# Patient Record
Sex: Male | Born: 1975 | Race: Black or African American | Hispanic: No | Marital: Married | Smoking: Never smoker
Health system: Southern US, Community
[De-identification: ages and names within clinical notes are randomized; demographics above are authoritative.]

---

## 2000-03-03 ENCOUNTER — Encounter: Payer: Self-pay | Admitting: Emergency Medicine

## 2000-03-03 ENCOUNTER — Emergency Department (HOSPITAL_COMMUNITY): Admission: EM | Admit: 2000-03-03 | Discharge: 2000-03-03 | Payer: Self-pay | Admitting: Emergency Medicine

## 2000-03-06 ENCOUNTER — Emergency Department (HOSPITAL_COMMUNITY): Admission: EM | Admit: 2000-03-06 | Discharge: 2000-03-06 | Payer: Self-pay | Admitting: Emergency Medicine

## 2000-03-13 ENCOUNTER — Emergency Department (HOSPITAL_COMMUNITY): Admission: EM | Admit: 2000-03-13 | Discharge: 2000-03-14 | Payer: Self-pay | Admitting: Emergency Medicine

## 2004-09-24 ENCOUNTER — Emergency Department (HOSPITAL_COMMUNITY): Admission: EM | Admit: 2004-09-24 | Discharge: 2004-09-24 | Payer: Self-pay | Admitting: Emergency Medicine

## 2004-09-26 ENCOUNTER — Emergency Department (HOSPITAL_COMMUNITY): Admission: EM | Admit: 2004-09-26 | Discharge: 2004-09-26 | Payer: Self-pay | Admitting: Family Medicine

## 2008-03-29 ENCOUNTER — Emergency Department (HOSPITAL_COMMUNITY): Admission: EM | Admit: 2008-03-29 | Discharge: 2008-03-30 | Payer: Self-pay | Admitting: Emergency Medicine
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
## 2022-03-27 ENCOUNTER — Encounter (HOSPITAL_COMMUNITY): Payer: Self-pay | Admitting: *Deleted

## 2022-03-27 ENCOUNTER — Ambulatory Visit (HOSPITAL_COMMUNITY)
Admission: EM | Admit: 2022-03-27 | Discharge: 2022-03-27 | Disposition: A | Discharge status: Home / Self Care | Payer: Self-pay | Attending: Family Medicine | Admitting: Family Medicine

## 2022-03-27 DIAGNOSIS — H5789 Other specified disorders of eye and adnexa: Secondary | ICD-10-CM

## 2022-03-27 MED ORDER — OLOPATADINE HCL 0.2 % OP SOLN: 1.0000 [drp] | Freq: Every day | OPHTHALMIC | 0 refills | Status: AC

## 2022-03-27 NOTE — ED Provider Notes (Signed)
  [place]   [id number] 03/27/22 Arrival Time: 2694  ASSESSMENT & PLAN:  1. Irritation of left eye    Meds ordered this encounter  Medications   Olopatadine HCl 0.2 % SOLN    Sig: Apply 1 drop to eye daily.    Dispense:  2.5 mL    Refill:  0   Work note provided.  Ophthalmic drops per orders. Local eye care discussed.  Reviewed expectations re: course of current medical issues. Questions answered. Outlined signs and symptoms indicating need for more acute intervention. Patient verbalized understanding. After Visit Summary given.   SUBJECTIVE:  Marvin Carroll is a 46 y.o. male who presents with complaint of LEFT eye "irritation"; noted this morning. Mild itching and watery discharge. H/O seasonal allergies; questions relation. Visual changes: no. Contact lens use: no. Recent illness: no. No tx PTA.  OBJECTIVE:  Vitals:   03/27/22 1006  BP: (!) 154/92  Pulse: 76  Resp: 18  Temp: 98.4 F (36.9 C)  TempSrc: Oral  SpO2: 98%    General appearance: alert; no distress HEENT: [place]; AT; PERRLA; no restriction of the extraocular movements OS: without reported pain; with mild conjunctival injection; with watery drainage; without corneal opacities; without limbal flush; without periorbital swelling or erythema Neck: supple without LAD Skin: warm and dry Psychological: alert and cooperative; normal mood and affect  No Known Allergies  History reviewed. No pertinent past medical history. Social History   Socioeconomic History   Marital status: Married    Spouse name: Not on file   Number of children: Not on file   Years of education: Not on file   Highest education level: Not on file  Occupational History   Not on file  Tobacco Use   Smoking status: Never   Smokeless tobacco: Never  Vaping Use   Vaping Use: Never used  Substance and Sexual Activity   Alcohol use: Yes    Comment: rare   Drug use: Never   Sexual activity: Not Currently   Other Topics Concern   Not on file  Social History Narrative   Not on file   Social Determinants of Health   Financial Resource Strain: Not on file  Food Insecurity: Not on file  Transportation Needs: Not on file  Physical Activity: Not on file  Stress: Not on file  Social Connections: Not on file  Intimate Partner Violence: Not on file   Family History  Problem Relation Age of Onset   Healthy Mother    Healthy Father    History reviewed. No pertinent surgical history.    Vanessa Kick, MD 03/27/22 1308

## 2022-03-27 NOTE — ED Triage Notes (Signed)
Pt states his left eye is pink he has been rubbing it a lot. This started at [place]. 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

## 2022-10-14 ENCOUNTER — Encounter (HOSPITAL_COMMUNITY): Payer: Self-pay

## 2022-10-14 ENCOUNTER — Emergency Department (HOSPITAL_COMMUNITY)
Admission: EM | Admit: 2022-10-14 | Discharge: 2022-10-14 | Disposition: A | Discharge status: Home / Self Care | Payer: BC Managed Care – PPO | Attending: Emergency Medicine | Admitting: Emergency Medicine

## 2022-10-14 ENCOUNTER — Emergency Department (HOSPITAL_COMMUNITY): Payer: BC Managed Care – PPO

## 2022-10-14 ENCOUNTER — Other Ambulatory Visit: Payer: Self-pay

## 2022-10-14 DIAGNOSIS — Z043 Encounter for examination and observation following other accident: Secondary | ICD-10-CM | POA: Diagnosis not present

## 2022-10-14 DIAGNOSIS — S52511A Displaced fracture of right radial styloid process, initial encounter for closed fracture: Secondary | ICD-10-CM | POA: Diagnosis not present

## 2022-10-14 DIAGNOSIS — S32001A Stable burst fracture of unspecified lumbar vertebra, initial encounter for closed fracture: Secondary | ICD-10-CM | POA: Diagnosis not present

## 2022-10-14 DIAGNOSIS — S52591A Other fractures of lower end of right radius, initial encounter for closed fracture: Secondary | ICD-10-CM | POA: Insufficient documentation

## 2022-10-14 DIAGNOSIS — W010XXA Fall on same level from slipping, tripping and stumbling without subsequent striking against object, initial encounter: Secondary | ICD-10-CM | POA: Diagnosis not present

## 2022-10-14 DIAGNOSIS — M549 Dorsalgia, unspecified: Secondary | ICD-10-CM | POA: Diagnosis not present

## 2022-10-14 DIAGNOSIS — S32011A Stable burst fracture of first lumbar vertebra, initial encounter for closed fracture: Secondary | ICD-10-CM | POA: Diagnosis not present

## 2022-10-14 MED ORDER — IBUPROFEN 600 MG PO TABS: 600.0000 mg | ORAL_TABLET | Freq: Four times a day (QID) | ORAL | 0 refills | Status: AC | PRN

## 2022-10-14 MED ORDER — OXYCODONE HCL 5 MG PO TABS: 2.5000 mg | ORAL_TABLET | ORAL | 0 refills | Status: AC | PRN

## 2022-10-14 MED ORDER — ACETAMINOPHEN 500 MG PO TABS
1000.0000 mg | ORAL_TABLET | Freq: Once | ORAL | Status: AC
  Administered 2022-10-14: 1000 mg via ORAL
  Filled 2022-10-14: qty 2

## 2022-10-14 MED ORDER — OXYCODONE HCL 5 MG PO TABS
5.0000 mg | ORAL_TABLET | Freq: Once | ORAL | Status: DC
  Filled 2022-10-14: qty 1

## 2022-10-14 NOTE — Progress Notes (Signed)
Orthopedic Tech Progress Note Patient Details:  Marvin Carroll 08-Dec-1975 [id number] TLSO brace has been ordered from [facility]  Patient ID: [name], male   DOB: [birth year], 47 y.o.   MRN: [id number]  [name] 10/14/2022, 9:44 PM

## 2022-10-14 NOTE — ED Provider Notes (Incomplete)
[place] EMERGENCY DEPARTMENT AT [facility] Provider Note   CSN: [id number] Arrival date & time: 10/14/22  1541     History {Add pertinent medical, surgical, social history, OB history to HPI:1} Chief Complaint  Patient presents with  . Fall    Marvin Carroll is a 47 y.o. male presents emergency department chief complaint of back and wrist pain.  Patient states that he was walking down his steps when he missed a stair, fell down 8 steps landed on his butt with his right hand trying to catch himself on the right side.  He has significant pain in his right wrist and mid back.  He is having difficulty finding a comfortable position.  He has been ambulatory since that time.  He denies numbness or tingling in his legs he denies any weakness or saddle anesthesia.  He has no numbing or numbness or tingling in the right hand.  He is right-hand dominant.  He did not hit his head or lose consciousness.   Fall       Home Medications Prior to Admission medications   Medication Sig Start Date End Date Taking? Authorizing Provider  Olopatadine HCl 0.2 % SOLN Apply 1 drop to eye daily. 03/27/22   Mardella Layman, MD      Allergies    Patient has no known allergies.    Review of Systems   Review of Systems  Physical Exam Updated Vital Signs BP (!) 143/87   Pulse 93   Temp 98.6 F (37 C) (Oral)   Resp 20   Ht 5\' 8"  (1.727 m)   Wt 89.4 kg   SpO2 96%   BMI 29.95 kg/m  Physical Exam Vitals and nursing note reviewed.  Constitutional:      General: He is not in acute distress.    Appearance: He is well-developed. He is not diaphoretic.  HENT:     Head: Normocephalic and atraumatic.  Eyes:     General: No scleral icterus.    Conjunctiva/sclera: Conjunctivae normal.  Cardiovascular:     Rate and Rhythm: Normal rate and regular rhythm.     Heart sounds: Normal heart sounds.  Pulmonary:     Effort: Pulmonary effort is normal. No respiratory distress.     Breath  sounds: Normal breath sounds.  Abdominal:     Palpations: Abdomen is soft.     Tenderness: There is no abdominal tenderness.  Musculoskeletal:     Cervical back: Normal range of motion and neck supple.     Comments: Wrist with deformity at the wrist.  Full range of motion of the fingers, radial ulnar and median. AIN/PIN/U intact. Radial pulse 2+ no scaphoid tenderness  Normal strength and sensation of the lower extremities, range of motion limited due to pain in the back, midline tenderness present.  Skin:    General: Skin is warm and dry.  Neurological:     Mental Status: He is alert.     Deep Tendon Reflexes: Reflexes normal.  Psychiatric:        Behavior: Behavior normal.     ED Results / Procedures / Treatments   Labs (all labs ordered are listed, but only abnormal results are displayed) Labs Reviewed - No data to display  EKG None  Radiology DG Thoracic Spine 2 View  Result Date: 10/14/2022 CLINICAL DATA:  Larey Seat down the stairs.  Back pain. EXAM: THORACIC SPINE 2 VIEWS COMPARISON:  Lumbar imaging same day FINDINGS: Mild scoliotic curvature. No fracture seen in  the thoracic region. Superior endplate fracture at L1 which is probably acute. See results of lumbar exam. IMPRESSION: Superior endplate fracture at L1 which is probably acute. See results of lumbar exam. Electronically Signed   By: Paulina Fusi M.D.   On: 10/14/2022 17:51   DG Lumbar Spine Complete  Result Date: 10/14/2022 CLINICAL DATA:  Larey Seat down the stairs EXAM: LUMBAR SPINE - COMPLETE 4+ VIEW COMPARISON:  Lumbar radiography October 2009 FINDINGS: There is a superior endplate fracture at L1 with slight kyphotic deformity. This was not present in 2009 and is presumed to be acute, though I cannot state that with absolute certainty based on these radiographs. Consider CT to confirm that. The rest of the lumbar spine is negative. IMPRESSION: Superior endplate fracture at L1 with slight kyphotic deformity. This was not  present in 2009 and is presumed to be acute, though I cannot state that with absolute certainty based on these radiographs. Electronically Signed   By: Paulina Fusi M.D.   On: 10/14/2022 17:49   DG Wrist Complete Right  Result Date: 10/14/2022 CLINICAL DATA:  Larey Seat down the stairs onto outstretched hand EXAM: RIGHT WRIST - COMPLETE 3+ VIEW COMPARISON:  None Available. FINDINGS: Comminuted, slightly impacted fracture of the distal radial metaphysis. Slight dorsal tilt of the distal radial articular surface. No ulnar fracture or carpal bone fracture. IMPRESSION: Comminuted, slightly impacted fracture of the distal radial metaphysis. Electronically Signed   By: Paulina Fusi M.D.   On: 10/14/2022 17:45    Procedures Procedures  {Document cardiac monitor, telemetry assessment procedure when appropriate:1}  Medications Ordered in ED Medications  oxyCODONE (Oxy IR/ROXICODONE) immediate release tablet 5 mg (5 mg Oral Not Given 10/14/22 1830)  acetaminophen (TYLENOL) tablet 1,000 mg (1,000 mg Oral Given 10/14/22 1627)    ED Course/ Medical Decision Making/ A&P Clinical Course as of 10/14/22 Fabio Neighbors Oct 14, 2022  1919 DG Thoracic Spine 2 View [AH]  1919 DG Lumbar Spine Complete [AH]  1919 DG Wrist Complete Right I visualized and interpreted lumbar film wrist x-ray and thoracic spine x-ray which shows a comminuted distal radial fracture and L1 superior endplate fracture.  I discussed the case with APP Maralyn Sago of 3 who recommends LSO and outpatient follow-up in the clinic.  Patient placed in sugar-tong splint.  CT imaging of the lumbar spine pending.  Pain medications offered. [AH]    Clinical Course User Index [AH] Arthor Captain, PA-C   {   Click here for ABCD2, HEART and other calculatorsREFRESH Note before signing :1}                          Medical Decision Making Amount and/or Complexity of Data Reviewed Radiology: ordered.  Risk OTC drugs. Prescription drug  management.   ***  {Document critical care time when appropriate:1} {Document review of labs and clinical decision tools ie heart score, Chads2Vasc2 etc:1}  {Document your independent review of radiology images, and any outside records:1} {Document your discussion with family members, caretakers, and with consultants:1} {Document social determinants of health affecting pt's care:1} {Document your decision making why or why not admission, treatments were needed:1} Final Clinical Impression(s) / ED Diagnoses Final diagnoses:  None    Rx / DC Orders ED Discharge Orders     None

## 2022-10-14 NOTE — ED Triage Notes (Signed)
Patient missed his step and fell down 8 steps. No LOC. Did not hit his head. Complaining of left lower back pain and right wrist pain.

## 2022-10-14 NOTE — Discharge Instructions (Signed)
Contact a health care provider if: You have a fever. Your pain medicine is not helping. Your pain does not get better over time. You cannot return to your normal activities as planned or expected. Get help right away if: You have difficulty breathing. Your pain is very bad and it suddenly gets worse. You have numbness, tingling, or weakness in any part of your body. You are unable to empty your bladder. You cannot control when you urinate or have bowel movements. You are unable to move any body part that is below the level of your injury. You have pain in your abdomen or uncontrolled vomiting. You have a warm, tender swelling in your leg. These symptoms may be an emergency. Get help right away. Call 911. Do not wait to see if the symptoms will go away. Do not drive yourself to the hospital. Contact a health care provider if: You have a fever. Your pain medicine is not helping. Your pain does not get better over time. You cannot return to your normal activities as planned or expected. Get help right away if: You have difficulty breathing. Your pain is very bad and it suddenly gets worse. You have numbness, tingling, or weakness in any part of your body. You are unable to empty your bladder. You cannot control when you urinate or have bowel movements. You are unable to move any body part that is below the level of your injury. You have pain in your abdomen or uncontrolled vomiting. You have a warm, tender swelling in your leg. These symptoms may be an emergency. Get help right away. Call 911. Do not wait to see if the symptoms will go away. Do not drive yourself to the hospital.

## 2022-10-24 ENCOUNTER — Ambulatory Visit (HOSPITAL_BASED_OUTPATIENT_CLINIC_OR_DEPARTMENT_OTHER): Payer: BC Managed Care – PPO | Admitting: Orthopaedic Surgery

## 2022-10-24 DIAGNOSIS — S52532A Colles' fracture of left radius, initial encounter for closed fracture: Secondary | ICD-10-CM

## 2022-10-24 NOTE — Progress Notes (Signed)
Chief Complaint: Right distal radius fracture     History of Present Illness:    Marvin Carroll is a 47 y.o. male right-hand-dominant male presents with a right distal radius fracture after he fell off of a ladder 1 week prior.  He went to the [facility] emergency room and was placed in a splint.  He is here today for further treatment.  He has been compliant with splint usage.  He has been nonweightbearing.  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 No past surgical history on file. Social History   Socioeconomic History   Marital status: Married    Spouse name: Not on file   Number of children: Not on file   Years of education: Not on file   Highest education level: Not on file  Occupational History   Not on file  Tobacco Use   Smoking status: Never   Smokeless tobacco: Never  Vaping Use   Vaping Use: Never used  Substance and Sexual Activity   Alcohol use: Yes    Comment: rare   Drug use: Never   Sexual activity: Not Currently  Other Topics Concern   Not on file  Social History Narrative   Not on file   Social Determinants of Health   Financial Resource Strain: Not on file  Food Insecurity: Not on file  Transportation Needs: Not on file  Physical Activity: Not on file  Stress: Not on file  Social Connections: Not on file   Family History  Problem Relation Age of Onset   Healthy Mother    Healthy Father    No Known Allergies Current Outpatient Medications  Medication Sig Dispense Refill   ibuprofen (ADVIL) 600 MG tablet Take 1 tablet (600 mg total) by mouth every 6 (six) hours as needed. 30 tablet 0   Olopatadine HCl 0.2 % SOLN Apply 1 drop to eye daily. 2.5 mL 0   oxyCODONE (ROXICODONE) 5 MG immediate release tablet Take 0.5-1 tablets (2.5-5 mg total) by mouth every 4 (four) hours as  needed for severe pain. 16 tablet 0   No current facility-administered medications for this visit.   No results found.  Review of Systems:   A ROS was performed including pertinent positives and negatives as documented in the HPI.  Physical Exam :   Constitutional: NAD and appears stated age Neurological: Alert and oriented Psych: Appropriate affect and cooperative There were no vitals taken for this visit.   Comprehensive Musculoskeletal Exam:    Tenderness palpation about the right distal radius with some bruising.  Sensation is intact all effusions of the right hand.  2+ radial pulse  Imaging:   Xray (3 views right wrist): Minimally dorsally displaced right distal radius overall in good alignment, extra-articular    I personally reviewed and interpreted the radiographs.   Assessment:   47 y.o. male with a minimally displaced right distal radius fracture after fall off a ladder.  This time I do believe that his overall alignment is quite good and would heal nicely in a cast.  Will plan to place a short arm cast.  I will see him back in 4 weeks for reassessment  Plan :    -  Return to clinic in 4 weeks for reassessment     I personally saw and evaluated the patient, and participated in the management and treatment plan.  Huel Cote, MD Attending Physician, Orthopedic Surgery  This document was dictated using Dragon voice recognition software. A reasonable attempt at proof reading has been made to minimize errors.

## 2022-11-12 DIAGNOSIS — S32001A Stable burst fracture of unspecified lumbar vertebra, initial encounter for closed fracture: Secondary | ICD-10-CM | POA: Diagnosis not present

## 2022-11-12 DIAGNOSIS — Z6829 Body mass index (BMI) 29.0-29.9, adult: Secondary | ICD-10-CM | POA: Diagnosis not present

## 2022-11-21 ENCOUNTER — Ambulatory Visit (HOSPITAL_BASED_OUTPATIENT_CLINIC_OR_DEPARTMENT_OTHER): Payer: BC Managed Care – PPO

## 2022-11-21 ENCOUNTER — Other Ambulatory Visit (HOSPITAL_BASED_OUTPATIENT_CLINIC_OR_DEPARTMENT_OTHER): Payer: Self-pay | Admitting: Orthopaedic Surgery

## 2022-11-21 ENCOUNTER — Ambulatory Visit (HOSPITAL_BASED_OUTPATIENT_CLINIC_OR_DEPARTMENT_OTHER): Payer: BC Managed Care – PPO | Admitting: Orthopaedic Surgery

## 2022-11-21 DIAGNOSIS — S52532A Colles' fracture of left radius, initial encounter for closed fracture: Secondary | ICD-10-CM

## 2022-11-21 DIAGNOSIS — S52531A Colles' fracture of right radius, initial encounter for closed fracture: Secondary | ICD-10-CM

## 2022-11-21 DIAGNOSIS — S52501D Unspecified fracture of the lower end of right radius, subsequent encounter for closed fracture with routine healing: Secondary | ICD-10-CM | POA: Diagnosis not present

## 2022-11-21 DIAGNOSIS — Z09 Encounter for follow-up examination after completed treatment for conditions other than malignant neoplasm: Secondary | ICD-10-CM

## 2022-11-21 NOTE — Progress Notes (Signed)
Chief Complaint: Right distal radius fracture     History of Present Illness:   11/21/2022: Presents today for follow-up of his right distal radius fracture and cast removal.  Marvin Carroll is a 47 y.o. male right-hand-dominant male presents with a right distal radius fracture after he fell off of a ladder 1 week prior.  He went to the [facility] emergency room and was placed in a splint.  He is here today for further treatment.  He has been compliant with splint usage.  He has been nonweightbearing.  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 No past surgical history on file. Social History   Socioeconomic History   Marital status: Married    Spouse name: Not on file   Number of children: Not on file   Years of education: Not on file   Highest education level: Not on file  Occupational History   Not on file  Tobacco Use   Smoking status: Never   Smokeless tobacco: Never  Vaping Use   Vaping Use: Never used  Substance and Sexual Activity   Alcohol use: Yes    Comment: rare   Drug use: Never   Sexual activity: Not Currently  Other Topics Concern   Not on file  Social History Narrative   Not on file   Social Determinants of Health   Financial Resource Strain: Not on file  Food Insecurity: Not on file  Transportation Needs: Not on file  Physical Activity: Not on file  Stress: Not on file  Social Connections: Not on file   Family History  Problem Relation Age of Onset   Healthy Mother    Healthy Father    No Known Allergies Current Outpatient Medications  Medication Sig Dispense Refill   ibuprofen (ADVIL) 600 MG tablet Take 1 tablet (600 mg total) by mouth every 6 (six) hours as needed. 30 tablet 0   Olopatadine HCl 0.2 % SOLN Apply 1 drop to eye daily. 2.5 mL 0   oxyCODONE (ROXICODONE) 5 MG  immediate release tablet Take 0.5-1 tablets (2.5-5 mg total) by mouth every 4 (four) hours as needed for severe pain. 16 tablet 0   No current facility-administered medications for this visit.   No results found.  Review of Systems:   A ROS was performed including pertinent positives and negatives as documented in the HPI.  Physical Exam :   Constitutional: NAD and appears stated age Neurological: Alert and oriented Psych: Appropriate affect and cooperative There were no vitals taken for this visit.   Comprehensive Musculoskeletal Exam:    Tenderness palpation about the right distal radius with some bruising.  Sensation is intact all effusions of the right hand.  2+ radial pulse  Imaging:   Xray (3 views right wrist): Minimally dorsally displaced right distal radius overall in good alignment, extra-articular with interval healing    I personally reviewed and interpreted the radiographs.   Assessment:   47 y.o. male with a healing right distal radius fracture.  X-rays today show good alignment.  Plan for cast removal and transition to removable wrist splint.  I will see him back in 4 weeks for final check  Plan :    -  Return to clinic in 4 weeks for reassessment     I personally saw and evaluated the patient, and participated in the management and treatment plan.  Huel Cote, MD Attending Physician, Orthopedic Surgery  This document was dictated using Dragon voice recognition software. A reasonable attempt at proof reading has been made to minimize errors.

## 2022-12-26 ENCOUNTER — Ambulatory Visit (HOSPITAL_BASED_OUTPATIENT_CLINIC_OR_DEPARTMENT_OTHER): Payer: PRIVATE HEALTH INSURANCE | Admitting: Orthopaedic Surgery

## 2023-01-09 ENCOUNTER — Ambulatory Visit (INDEPENDENT_AMBULATORY_CARE_PROVIDER_SITE_OTHER): Payer: BC Managed Care – PPO

## 2023-01-09 ENCOUNTER — Other Ambulatory Visit (HOSPITAL_BASED_OUTPATIENT_CLINIC_OR_DEPARTMENT_OTHER): Payer: Self-pay | Admitting: Orthopaedic Surgery

## 2023-01-09 ENCOUNTER — Ambulatory Visit (INDEPENDENT_AMBULATORY_CARE_PROVIDER_SITE_OTHER): Payer: BC Managed Care – PPO | Admitting: Student

## 2023-01-09 DIAGNOSIS — S6291XD Unspecified fracture of right wrist and hand, subsequent encounter for fracture with routine healing: Secondary | ICD-10-CM | POA: Diagnosis not present

## 2023-01-09 DIAGNOSIS — S52532A Colles' fracture of left radius, initial encounter for closed fracture: Secondary | ICD-10-CM

## 2023-01-09 DIAGNOSIS — S52531A Colles' fracture of right radius, initial encounter for closed fracture: Secondary | ICD-10-CM

## 2023-01-09 DIAGNOSIS — S52321D Displaced transverse fracture of shaft of right radius, subsequent encounter for closed fracture with routine healing: Secondary | ICD-10-CM | POA: Diagnosis not present

## 2023-01-09 DIAGNOSIS — S52531D Colles' fracture of right radius, subsequent encounter for closed fracture with routine healing: Secondary | ICD-10-CM | POA: Diagnosis not present

## 2023-01-09 DIAGNOSIS — S52532D Colles' fracture of left radius, subsequent encounter for closed fracture with routine healing: Secondary | ICD-10-CM | POA: Diagnosis not present

## 2023-01-09 NOTE — Progress Notes (Signed)
Chief Complaint: Right distal radius fracture     History of Present Illness:   01/09/2023: Patient presents today 3 months status post right distal radius fracture.  Today he reports doing very well and is having no pain.  Has come out of the removable wrist brace completely over the last 2 weeks.  Taking ibuprofen occasionally as needed.  Was able to shoot the basketball without any issues yesterday.   10/24/22: HAAKEN Carroll is a 47 y.o. male right-hand-dominant male presents with a right distal radius fracture after he fell off of a ladder 1 week prior.  He went to the [facility] emergency room and was placed in a splint.  He is here today for further treatment.  He has been compliant with splint usage.  He has been nonweightbearing.  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 No past surgical history on file. Social History   Socioeconomic History   Marital status: Married    Spouse name: Not on file   Number of children: Not on file   Years of education: Not on file   Highest education level: Not on file  Occupational History   Not on file  Tobacco Use   Smoking status: Never   Smokeless tobacco: Never  Vaping Use   Vaping status: Never Used  Substance and Sexual Activity   Alcohol use: Yes    Comment: rare   Drug use: Never   Sexual activity: Not Currently  Other Topics Concern   Not on file  Social History Narrative   Not on file   Social Determinants of Health   Financial Resource Strain: Not on file  Food Insecurity: Not on file  Transportation Needs: Not on file  Physical Activity: Not on file  Stress: Not on file  Social Connections: Not on file   Family History  Problem Relation Age of Onset   Healthy Mother    Healthy Father    No Known Allergies Current Outpatient Medications   Medication Sig Dispense Refill   ibuprofen (ADVIL) 600 MG tablet Take 1 tablet (600 mg total) by mouth every 6 (six) hours as needed. 30 tablet 0   Olopatadine HCl 0.2 % SOLN Apply 1 drop to eye daily. 2.5 mL 0   oxyCODONE (ROXICODONE) 5 MG immediate release tablet Take 0.5-1 tablets (2.5-5 mg total) by mouth every 4 (four) hours as needed for severe pain. 16 tablet 0   No current facility-administered medications for this visit.   No results found.  Review of Systems:   A ROS was performed including pertinent positives and negatives as documented in the HPI.  Physical Exam :   Constitutional: NAD and appears stated age Neurological: Alert and oriented Psych: Appropriate affect and cooperative There were no vitals taken for this visit.   Comprehensive Musculoskeletal Exam:    Tenderness over radial aspect of the right distal radius.  Mild swelling and no ecchymosis.  Active range of motion 30 degrees extension and 90 degrees flexion of the right wrist.  Can form a fist with 5/5 strength.  Radial pulse 2+.  Neurosensory intact.   Imaging:   Xray (3 views right wrist): Good alignment of distal  radius fracture with increased callus and bone formation    I personally reviewed and interpreted the radiographs.   Assessment:   47 y.o. male 1-month status post right distal radius fracture.  Based on x-rays taken today, this has healed in well with good alignment.  He can now fully return to all activities as tolerated.  Recommend that he can continue using removable brace, particularly for higher level activity or lifting.  At this point he can return to clinic as needed.  Plan :    -Return to clinic as needed     I personally saw and evaluated the patient, and participated in the management and treatment plan.  Hazle Nordmann, PA-C Orthopedics  This document was dictated using Conservation officer, historic buildings. A reasonable attempt at proof reading has been made to  minimize errors.
